# Patient Record
Sex: Male | Born: 1995 | Race: White | Hispanic: No | Marital: Single | Smoking: Current some day smoker
Health system: Southern US, Community
[De-identification: ages and names within clinical notes are randomized; demographics above are authoritative.]

## PROBLEM LIST (undated history)

## (undated) DIAGNOSIS — F909 Attention-deficit hyperactivity disorder, unspecified type: Secondary | ICD-10-CM

---
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
## 2017-12-29 HISTORY — PX: FRACTURE SURGERY: SHX138

## 2018-09-06 ENCOUNTER — Encounter (HOSPITAL_BASED_OUTPATIENT_CLINIC_OR_DEPARTMENT_OTHER): Payer: Self-pay

## 2018-09-06 ENCOUNTER — Other Ambulatory Visit: Payer: Self-pay

## 2018-09-06 ENCOUNTER — Emergency Department (HOSPITAL_BASED_OUTPATIENT_CLINIC_OR_DEPARTMENT_OTHER)
Admission: EM | Admit: 2018-09-06 | Discharge: 2018-09-06 | Disposition: A | Discharge status: Home / Self Care | Payer: BLUE CROSS/BLUE SHIELD | Attending: Emergency Medicine | Admitting: Emergency Medicine

## 2018-09-06 ENCOUNTER — Emergency Department (HOSPITAL_BASED_OUTPATIENT_CLINIC_OR_DEPARTMENT_OTHER): Payer: BLUE CROSS/BLUE SHIELD

## 2018-09-06 DIAGNOSIS — F909 Attention-deficit hyperactivity disorder, unspecified type: Secondary | ICD-10-CM | POA: Insufficient documentation

## 2018-09-06 DIAGNOSIS — F17200 Nicotine dependence, unspecified, uncomplicated: Secondary | ICD-10-CM | POA: Diagnosis not present

## 2018-09-06 DIAGNOSIS — M546 Pain in thoracic spine: Secondary | ICD-10-CM | POA: Insufficient documentation

## 2018-09-06 DIAGNOSIS — Z79899 Other long term (current) drug therapy: Secondary | ICD-10-CM | POA: Insufficient documentation

## 2018-09-06 DIAGNOSIS — M25511 Pain in right shoulder: Secondary | ICD-10-CM | POA: Diagnosis present

## 2018-09-06 MED ORDER — NAPROXEN 500 MG PO TABS: 500.0000 mg | ORAL_TABLET | Freq: Two times a day (BID) | ORAL | 0 refills | Status: AC

## 2018-09-06 MED ORDER — NAPROXEN 250 MG PO TABS
500.0000 mg | ORAL_TABLET | Freq: Once | ORAL | Status: AC
  Administered 2018-09-06: 500 mg via ORAL
  Filled 2018-09-06: qty 2

## 2018-09-06 MED ORDER — METHOCARBAMOL 500 MG PO TABS: 500.0000 mg | ORAL_TABLET | Freq: Three times a day (TID) | ORAL | 0 refills | Status: AC | PRN

## 2018-09-06 NOTE — ED Provider Notes (Signed)
[facility] EMERGENCY DEPARTMENT Provider Note   CSN: [id number] Arrival date & time: 09/06/18  1723     History   Chief Complaint Chief Complaint  Patient presents with  . Back Pain    HPI Albert Buck is a 22 y.o. male with a hx of ADHD, tobacco abuse, and prior R sided clavicular fracture s/p surgical repair 12/2017 who presents to the ED with complaints of R shoulder pain and back pain today. Patient states he has had intermittent pain to the R shoulder/clavical area since his surgery in January of this year. He states over the weekend he was doing fairly heavy lifting when a cabinet fell back and hit him in the R shoulder/back area- 3 days ago. He states he has had pain to these areas since the incident. Current pain is a 9/10 in severity, worse with movement, no alleviating factors. Has tried tylenol, ibuprofen, and topical essential oils without relief. Denies  numbness, tingling, weakness, incontinence to bowel/bladder, fever, chills, IV drug use, or hx of cancer.   HPI  Past Medical History:  Diagnosis Date  . ADHD (attention deficit hyperactivity disorder)     Patient Active Problem List   Diagnosis Date Noted  . ADHD (attention deficit hyperactivity disorder) 03/29/2013    Past Surgical History:  Procedure Laterality Date  . FRACTURE SURGERY  12/2017   collarbone        Home Medications    Prior to Admission medications   Medication Sig Start Date End Date Taking? Authorizing Provider  acetaminophen-codeine (TYLENOL #3) 300-30 MG per tablet Take 1 tablet by mouth every 4 (four) hours as needed for pain. 03/29/13   Laurell Josephs, MD  amphetamine-dextroamphetamine (ADDERALL XR) 15 MG 24 hr capsule Take 1 capsule (15 mg total) by mouth every morning. 04/05/13   Laurell Josephs, MD  clonazePAM (KLONOPIN) 0.5 MG tablet Take 1 tablet (0.5 mg total) by mouth 2 (two) times daily as needed for anxiety. 04/06/13   Laurell Josephs, MD  dextroamphetamine  (DEXEDRINE SPANSULE) 15 MG 24 hr capsule  02/20/13   [provider]  fluticasone (FLONASE) 50 MCG/ACT nasal spray  01/14/13   [provider]  montelukast (SINGULAIR) 10 MG tablet Take 10 mg by mouth at bedtime.    [provider]  sertraline (ZOLOFT) 50 MG tablet Take 1 tablet (50 mg total) by mouth daily. 04/06/13   Laurell Josephs, MD    Family History No family history on file.  Social History Social History   Tobacco Use  . Smoking status: Current Some Day Smoker  . Smokeless tobacco: Current User  Substance Use Topics  . Alcohol use: Yes    Comment: every other weekend  . Drug use: No    Comment: previously not currently     Allergies   Patient has no known allergies.   Review of Systems Review of Systems  Constitutional: Negative for chills and fever.  Respiratory: Negative for shortness of breath.   Cardiovascular: Negative for chest pain.  Musculoskeletal: Positive for arthralgias (R shoulder/clavicle area) and back pain.  Neurological: Negative for weakness and numbness.       Negative for incontinence/saddle anesthesia.    Physical Exam Updated Vital Signs BP 135/80 (BP Location: Left Arm)   Pulse 88   Temp 98.3 F (36.8 C) (Oral)   Resp 18   Ht 6\' 2"  (1.88 m)   Wt 81.6 kg   SpO2 100%   BMI 23.11 kg/m  Physical Exam  Constitutional: He appears well-developed and well-nourished. No distress.  HENT:  Head: Normocephalic and atraumatic.  Eyes: Conjunctivae are normal. Right eye exhibits no discharge. Left eye exhibits no discharge.  Neck: Normal range of motion. Neck supple. No spinous process tenderness present.  Cardiovascular:  Pulses:      Radial pulses are 2+ on the right side, and 2+ on the left side.  Musculoskeletal:  Patient has well healed surgical scar in the R clavicular area. There are no obvious deformities, appreciable swelling, erythema, ecchymosis, open wounds, or increased warmth.  Upper extremities: Full  active range of motion to bilateral upper extremities, some discomfort with R shoulder flexion. Tender diffusely to R shoulder and clavicle without point/focal tenderness. Upper extremities otherwise nontender Back: Tender diffusely to thoracic region including bilateral paraspinal muscles and midline without point/focal tenderness to palpation. No lumbar or cervical tenderness.  Lower extremities: Nontender.   Neurological: He is alert.  Clear speech. Sensation grossly intact to bilateral upper and lower extremities. 5/5 symmetric grip strength. 5/5 symmetric strength with plantar/dorsiflexion bilaterally. Ambulatory.   Psychiatric: He has a normal mood and affect. His behavior is normal. Thought content normal.  Nursing note and vitals reviewed.   ED Treatments / Results  Labs (all labs ordered are listed, but only abnormal results are displayed) Labs Reviewed - No data to display  EKG None  Radiology Dg Thoracic Spine 2 View  Result Date: 09/06/2018 CLINICAL DATA:  22 year old male with injury of injury to the right shoulder 2 days ago. Lower thoracic pain. EXAM: THORACIC SPINE 2 VIEWS COMPARISON:  None. FINDINGS: There is no evidence of thoracic spine fracture. Alignment is normal. No other significant bone abnormalities are identified. IMPRESSION: Negative. Electronically Signed   By: Trudie Reed M.D.   On: 09/06/2018 19:12   Dg Clavicle Right  Result Date: 09/06/2018 CLINICAL DATA:  Injury to RIGHT shoulder 2 days ago, was moving stuff and a cabinet fell on a shoulder, pain mostly anterior shoulder, lower thoracic pain EXAM: RIGHT CLAVICLE - 2+ VIEWS COMPARISON:  12/27/2017 FINDINGS: Osseous mineralization normal. Plate and screws identified at the mid to distal RIGHT clavicle post ORIF of previously identified mid RIGHT clavicular fracture. AC joint alignment normal. No acute fracture, dislocation, or bone destruction. IMPRESSION: Prior RIGHT clavicular ORIF. No acute abnormalities.  Electronically Signed   By: Ulyses Southward M.D.   On: 09/06/2018 19:13   Dg Shoulder Right  Result Date: 09/06/2018 CLINICAL DATA:  Right shoulder injury, pain EXAM: RIGHT SHOULDER - 2+ VIEW COMPARISON:  12/27/2017 FINDINGS: Plate and screw fixation device noted in the right clavicle related to old injury. No acute fracture, subluxation or dislocation. IMPRESSION: No acute bony abnormality. Electronically Signed   By: Charlett Nose M.D.   On: 09/06/2018 19:12    Procedures Procedures (including critical care time)  Medications Ordered in ED Medications - No data to display   Initial Impression / Assessment and Plan / ED Course  I have reviewed the triage vital signs and the nursing notes.  Pertinent labs & imaging results that were available during my care of the patient were reviewed by me and considered in my medical decision making (see chart for details).   Patient presents to the ED with complaints of R shoulder and mid to upper back pain s/p heavy lifting with cabinet falling onto some areas of discomfort. Patient nontoxic appearing, in no apparent distress, vitals WNL. Imaging obtained in areas of tenderness negative for fracture/dislocation. NVI distally.  No back pain red flags. Reproducible with palpation. Suspect muscle strain/spasm. Will treat with naproxen/robaxin, instructed patient not to drive or operate heavy machinery when taking robaxin. I discussed results, treatment plan, need for follow-up, and return precautions with the patient. Provided opportunity for questions, patient confirmed understanding and is in agreement with plan.    Final Clinical Impressions(s) / ED Diagnoses   Final diagnoses:  Acute pain of right shoulder  Acute bilateral thoracic back pain    ED Discharge Orders         Ordered    naproxen (NAPROSYN) 500 MG tablet  2 times daily     09/06/18 1917    methocarbamol (ROBAXIN) 500 MG tablet  Every 8 hours PRN     09/06/18 [address], PA-C 09/06/18 1932    Loren Racer, MD 09/10/18 [phone]

## 2018-09-06 NOTE — ED Notes (Signed)
Pt. Reports pain in the R shoulder and mid upper back pain after moving a large piece of furniture on Sat.  Pt. Able to move the arm and is able to bend in the back area.  No noted resp. Issues.

## 2018-09-06 NOTE — ED Triage Notes (Signed)
Pt c/o right shoulder pain and back pain after a cabinet fell on him that he was moving on Saturday, pt took two tylenol at lunch without relief

## 2018-09-06 NOTE — Discharge Instructions (Signed)
Please read and follow all provided instructions.  You have been seen today for pain to your back, shoulder, and clavicle.   Tests performed today include: An x-ray of the affected areas - does NOT show any broken bones or dislocations.  Vital signs. See below for your results today.   Medications: - Naproxen is a nonsteroidal anti-inflammatory medication that will help with pain and swelling. Be sure to take this medication as prescribed with food, 1 pill every 12 hours,  It should be taken with food, as it can cause stomach upset, and more seriously, stomach bleeding. Do not take other nonsteroidal anti-inflammatory medications with this such as Advil, Motrin, Aleve, Mobic, Goodie Powder, or Motrin.    - Robaxin is the muscle relaxer I have prescribed, this is meant to help with muscle tightness. Be aware that this medication may make you drowsy therefore the first time you take this it should be at a time you are in an environment where you can rest. Do not drive or operate heavy machinery when taking this medication. Do not drink alcohol or take other sedating medications with this medicine such as narcotics or benzodiazepines.   You make take Tylenol per over the counter dosing with these medications.   We have prescribed you new medication(s) today. Discuss the medications prescribed today with your pharmacist as they can have adverse effects and interactions with your other medicines including over the counter and prescribed medications. Seek medical evaluation if you start to experience new or abnormal symptoms after taking one of these medicines, seek care immediately if you start to experience difficulty breathing, feeling of your throat closing, facial swelling, or rash as these could be indications of a more serious allergic reaction   Follow-up instructions: Please follow-up with your primary care provider or the provided orthopedic physician (bone specialist) if you continue to have  significant pain in 1 week. In this case you may have a more severe injury that requires further care.   Return instructions:  Please return if your digits or extremity are numb or tingling, appear gray or blue, or you have severe pain (also elevate the extremity and loosen splint or wrap if you were given one) Please return if you have redness or fevers.  Please return to the Emergency Department if you experience worsening symptoms.  Please return if you have any other emergent concerns. Additional Information:  Your vital signs today were: BP 135/80 (BP Location: Left Arm)    Pulse 88    Temp 98.3 F (36.8 C) (Oral)    Resp 18    Ht 6\' 2"  (1.88 m)    Wt 81.6 kg    SpO2 100%    BMI 23.11 kg/m  If your blood pressure (BP) was elevated above 135/85 this visit, please have this repeated by your doctor within one month. ---------------

## 2018-11-28 DEATH — deceased

## 2019-07-24 IMAGING — DX DG CLAVICLE*R*
2 series · 2 of 2 positions shown · non-contrast
Comparison: 12/27/2017

CLINICAL DATA: Injury to RIGHT shoulder 2 days ago, was moving
stuff and a cabinet fell on a shoulder, pain mostly anterior
shoulder, lower thoracic pain

EXAM:
RIGHT CLAVICLE - 2+ VIEWS

[clavicle ap]
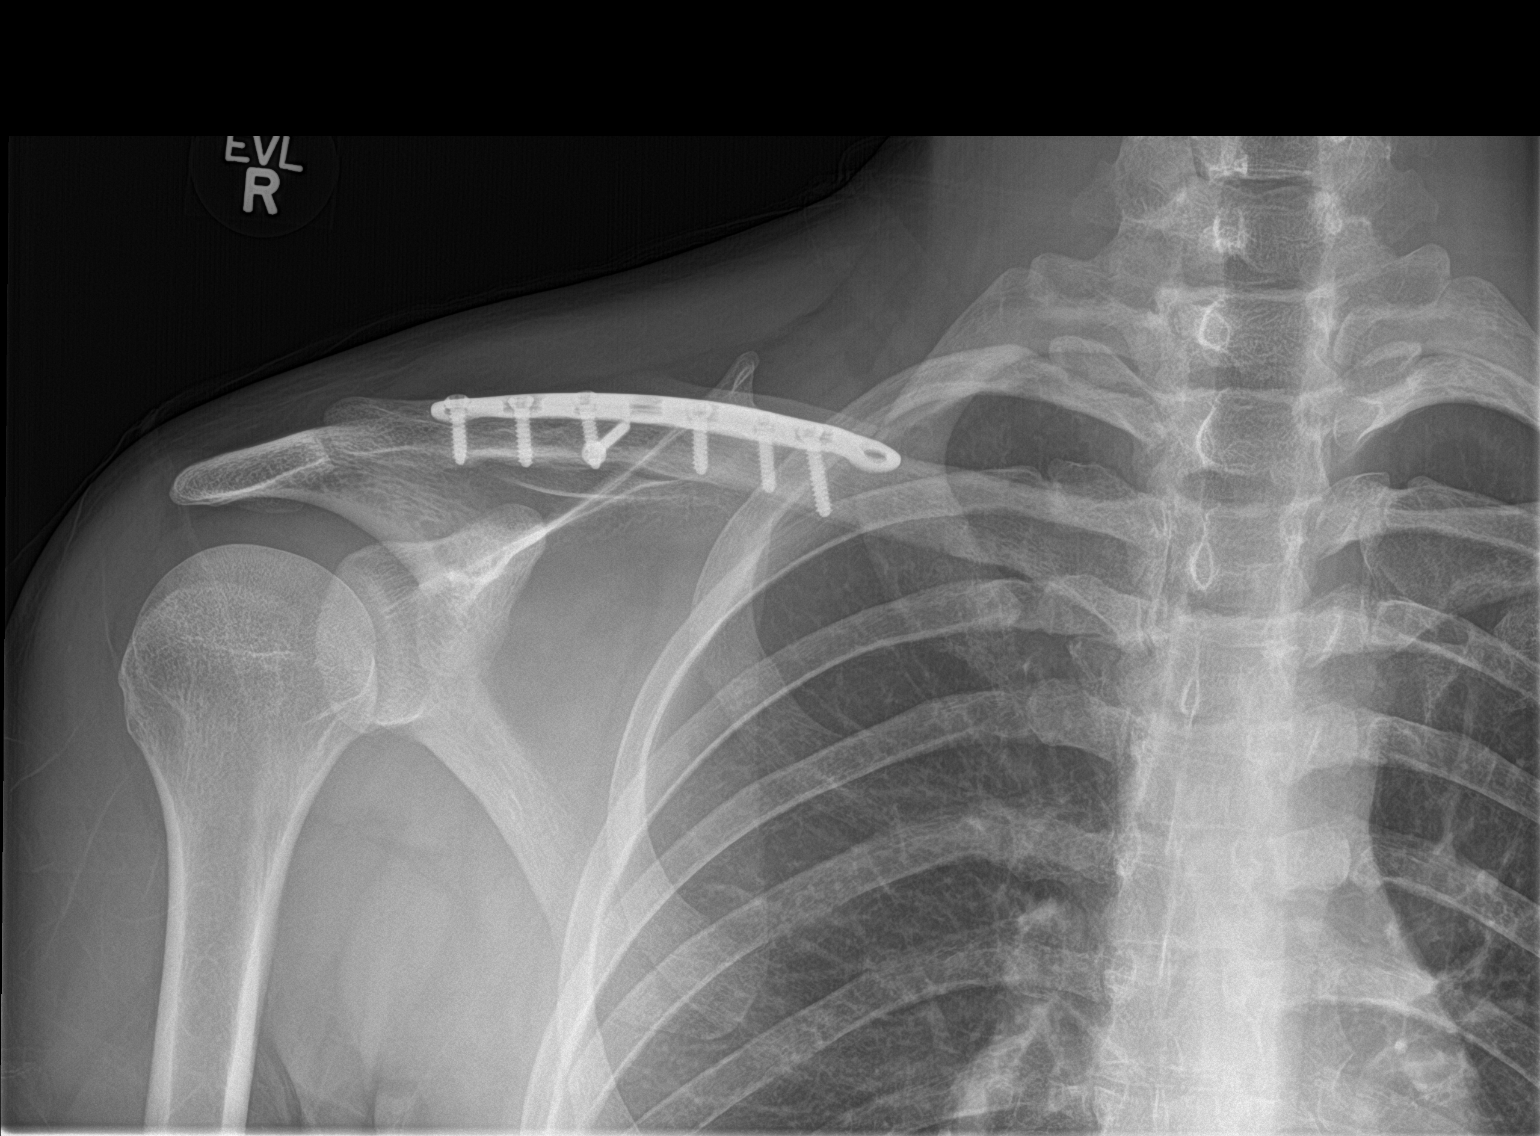

[clavicle axial]
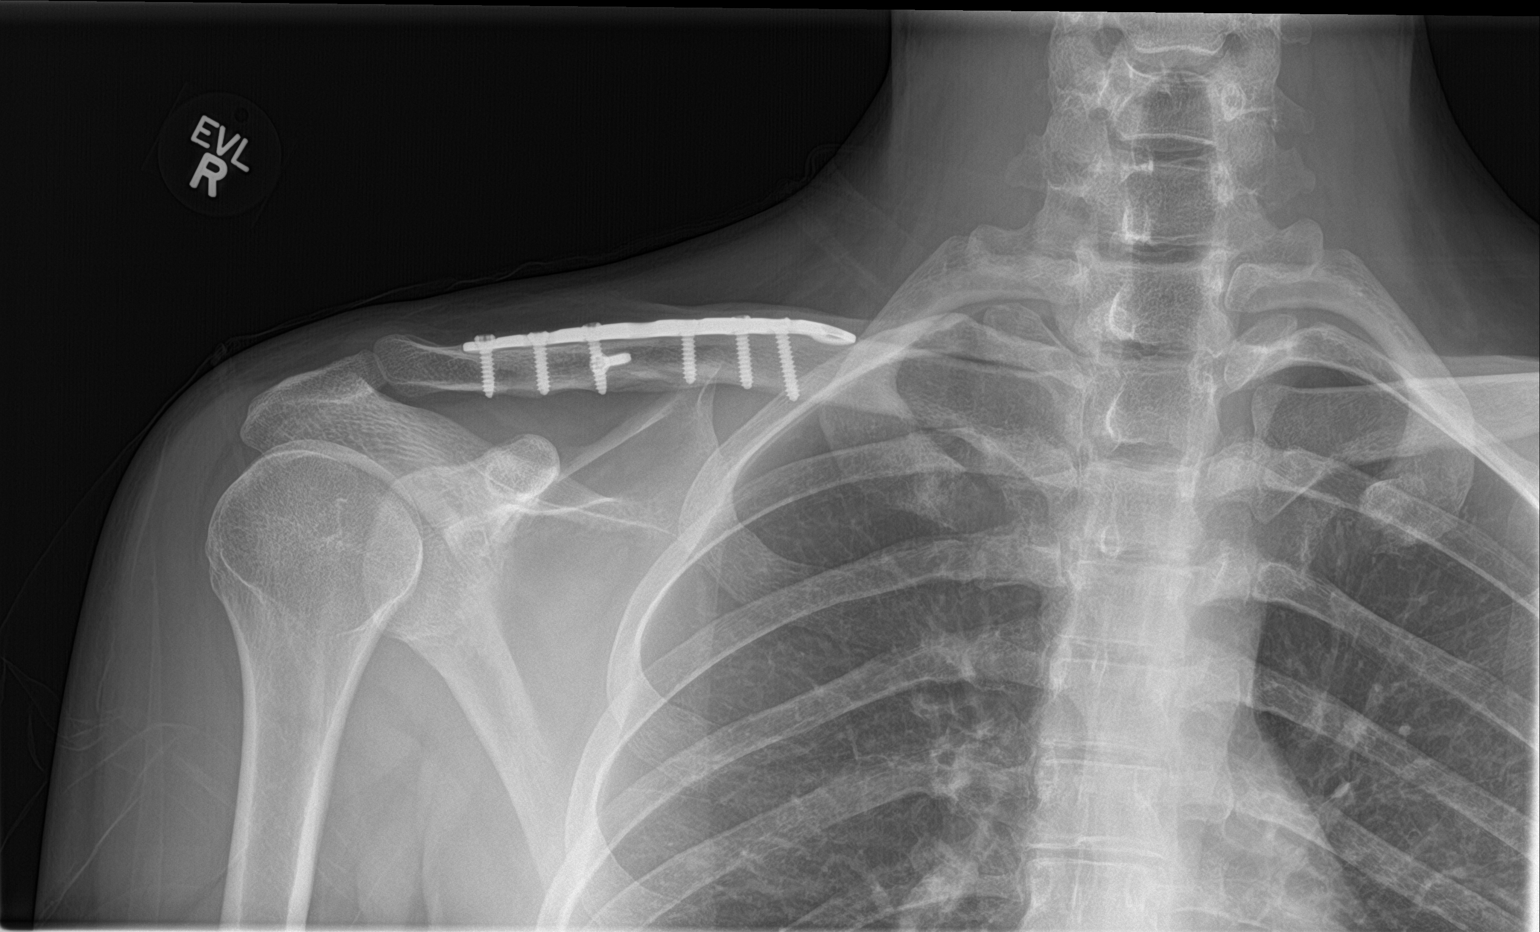

[2 of 2 positions shown; findings below may reference images not displayed]

FINDINGS: Osseous mineralization normal.

Plate and screws identified at the mid to distal RIGHT clavicle post
ORIF of previously identified mid RIGHT clavicular fracture.

AC joint alignment normal.

No acute fracture, dislocation, or bone destruction.
IMPRESSION: Prior RIGHT clavicular ORIF.

No acute abnormalities.

## 2019-07-24 IMAGING — DX DG SHOULDER 2+V*R*
3 series · 3 of 3 positions shown · non-contrast
Comparison: 12/27/2017

CLINICAL DATA: Right shoulder injury, pain

EXAM:
RIGHT SHOULDER - 2+ VIEW

[shoulder grashey]
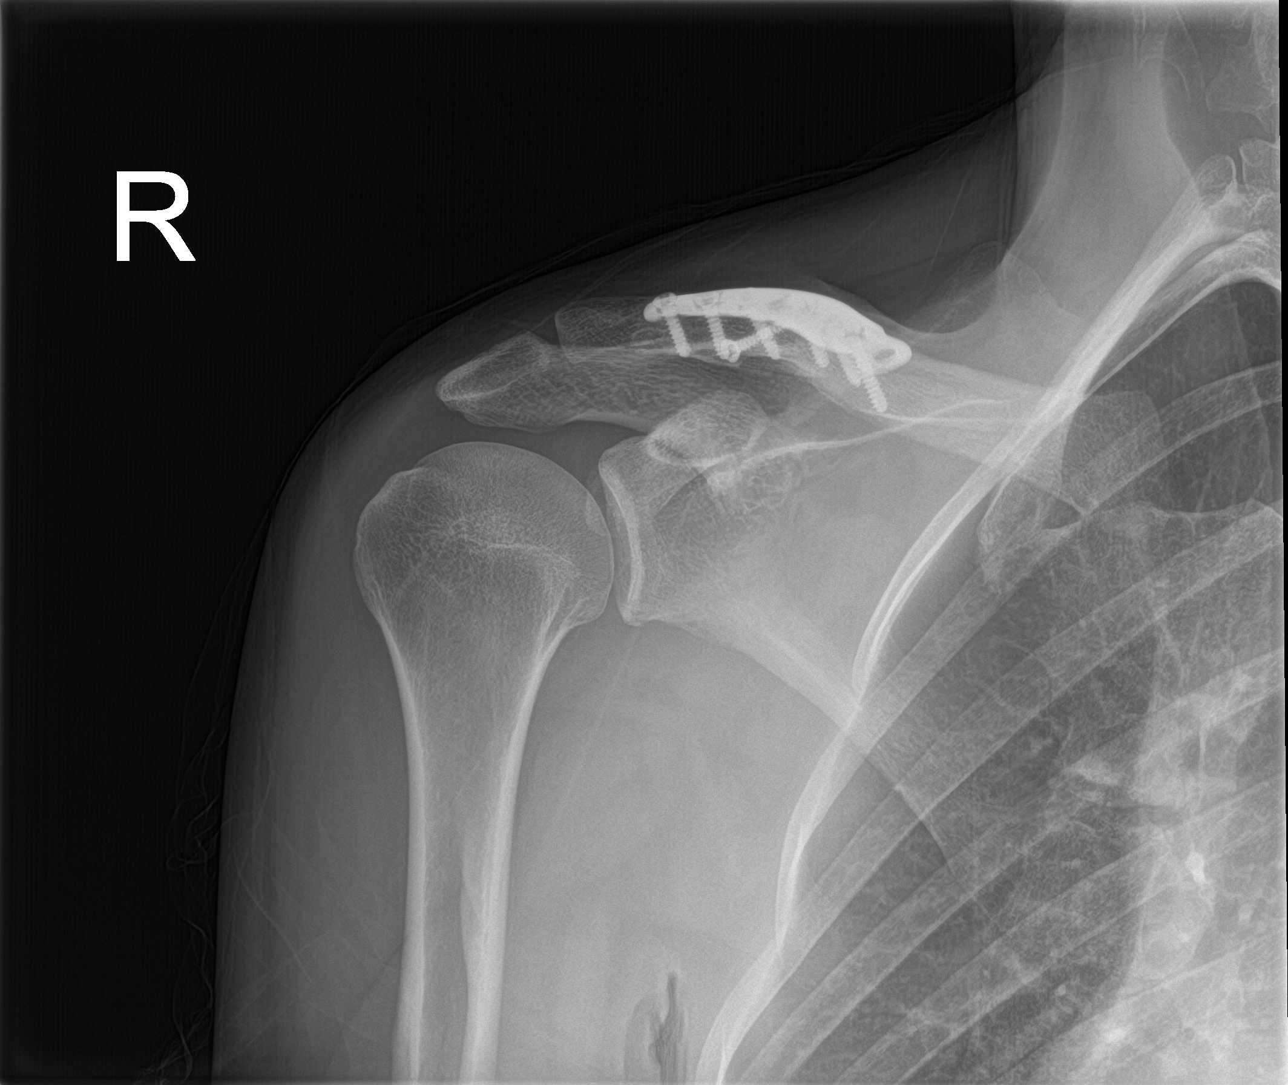

[shoulder y view]
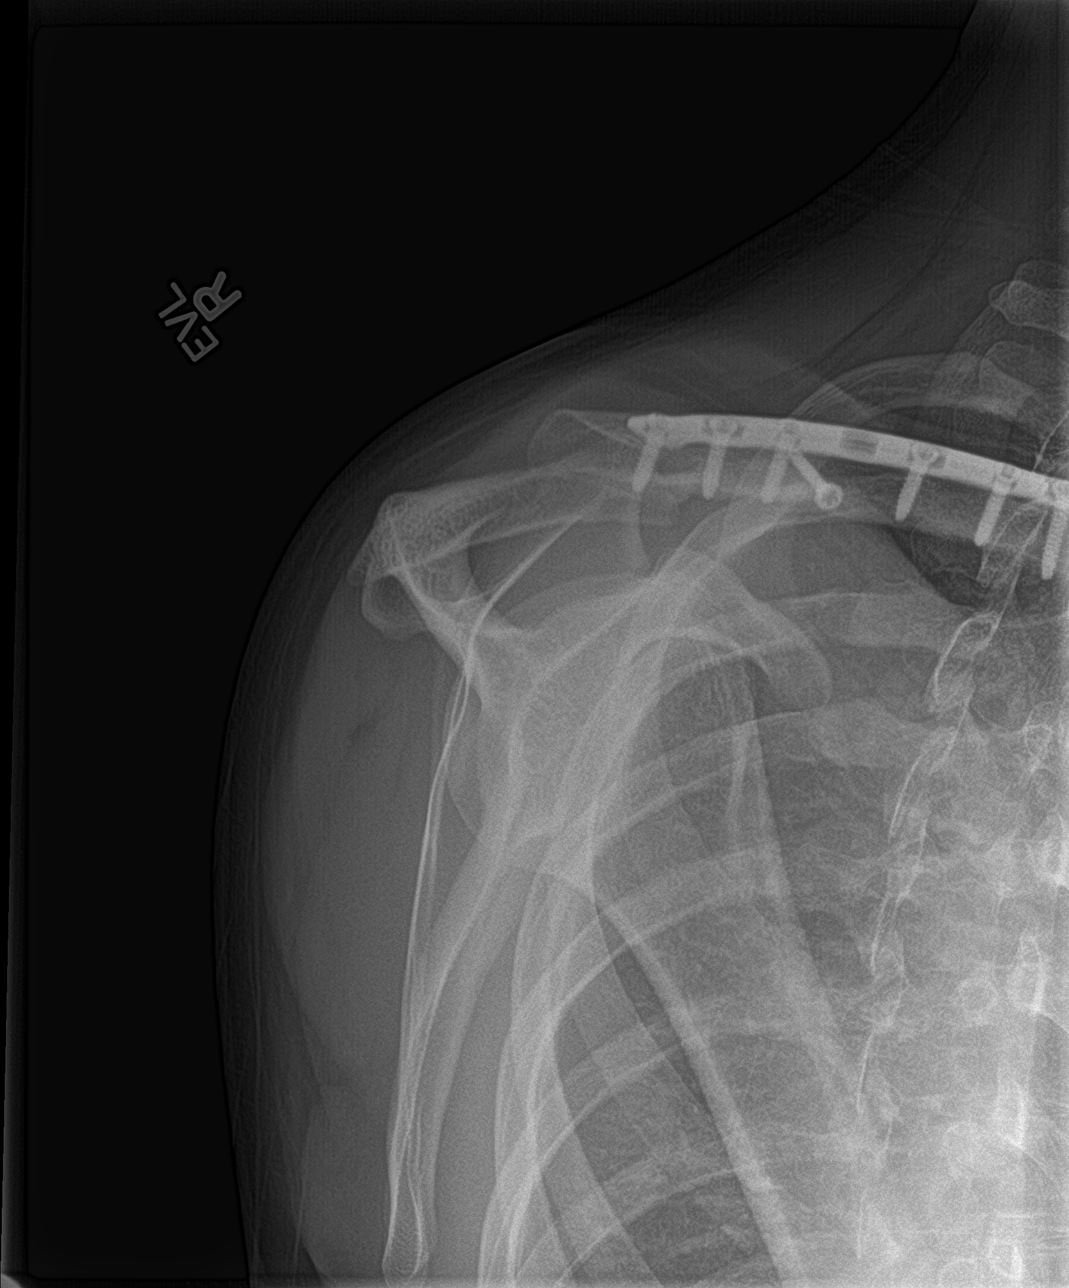

[shoulder axillary]
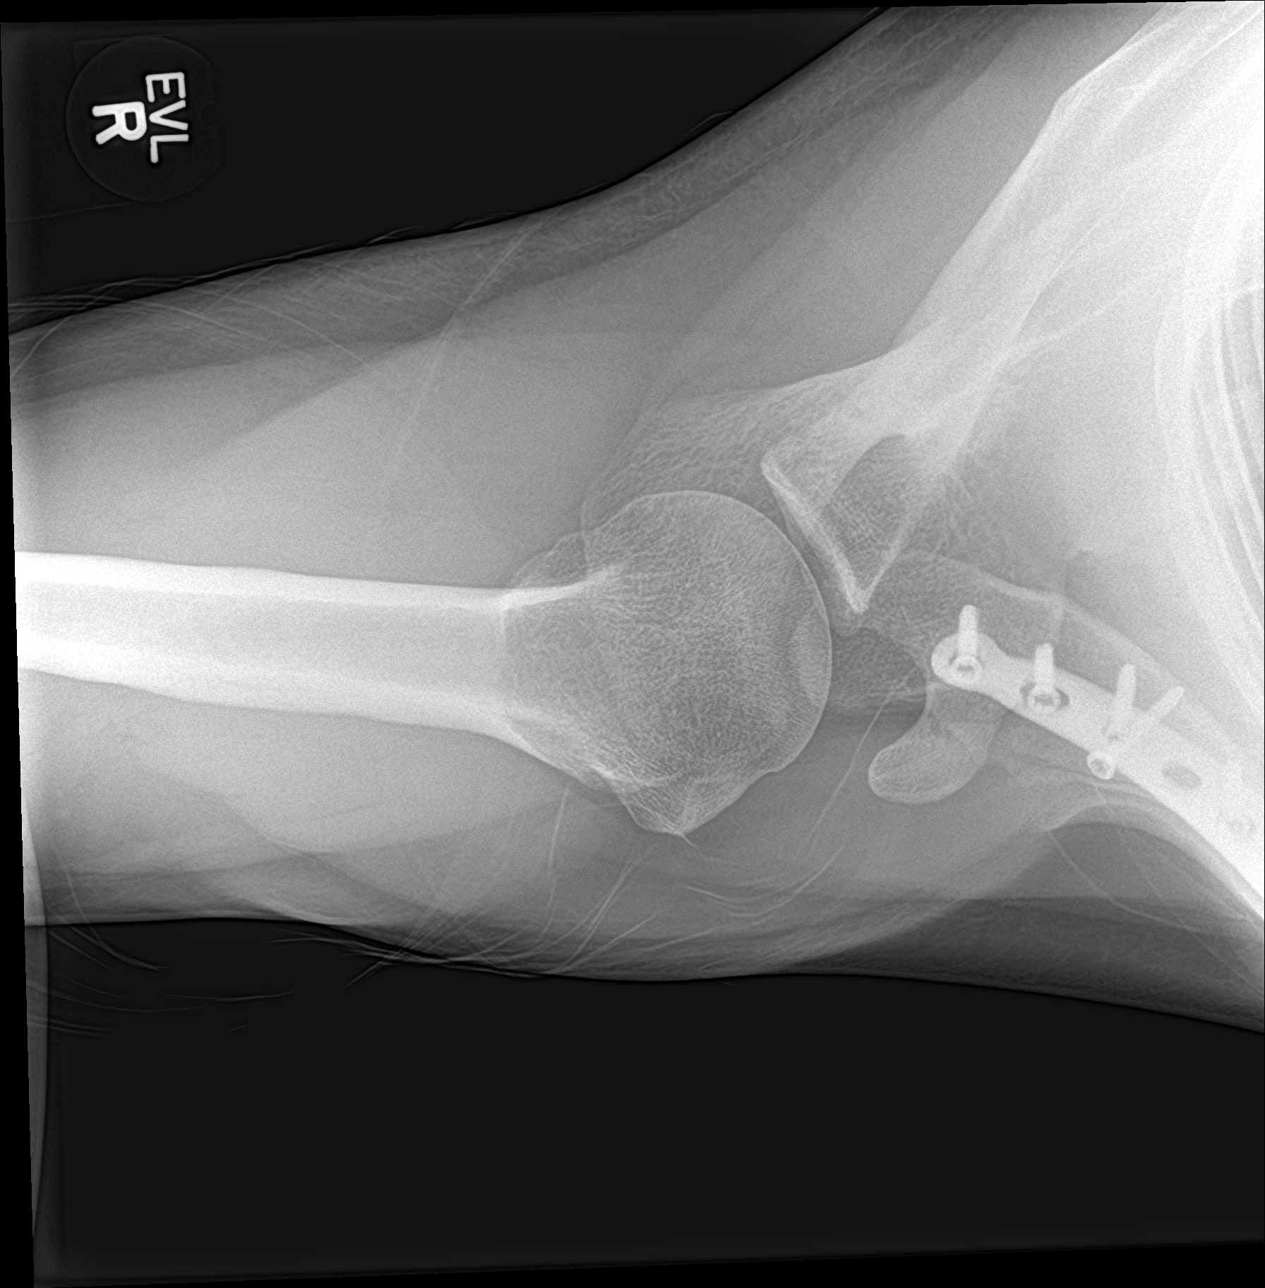

[3 of 3 positions shown; findings below may reference images not displayed]

FINDINGS: Plate and screw fixation device noted in the right clavicle related
to old injury. No acute fracture, subluxation or dislocation.
IMPRESSION: No acute bony abnormality.
